# Patient Record
Sex: Male | Born: 1995 | Race: White | Hispanic: No | Marital: Single | Smoking: Never smoker
Health system: Southern US, Community
[De-identification: ages and names within clinical notes are randomized; demographics above are authoritative.]

## PROBLEM LIST (undated history)

## (undated) DIAGNOSIS — J45909 Unspecified asthma, uncomplicated: Secondary | ICD-10-CM

## (undated) DIAGNOSIS — T782XXA Anaphylactic shock, unspecified, initial encounter: Secondary | ICD-10-CM

## (undated) HISTORY — PX: TONSILLECTOMY AND ADENOIDECTOMY: SHX28

---
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
## 2020-02-10 ENCOUNTER — Ambulatory Visit (HOSPITAL_COMMUNITY)
Admission: EM | Admit: 2020-02-10 | Discharge: 2020-02-10 | Disposition: A | Discharge status: Home / Self Care | Payer: BC Managed Care – PPO | Attending: Family Medicine | Admitting: Family Medicine

## 2020-02-10 ENCOUNTER — Encounter (HOSPITAL_COMMUNITY): Payer: Self-pay

## 2020-02-10 ENCOUNTER — Other Ambulatory Visit: Payer: Self-pay

## 2020-02-10 DIAGNOSIS — R300 Dysuria: Secondary | ICD-10-CM | POA: Diagnosis present

## 2020-02-10 DIAGNOSIS — N390 Urinary tract infection, site not specified: Secondary | ICD-10-CM

## 2020-02-10 DIAGNOSIS — R369 Urethral discharge, unspecified: Secondary | ICD-10-CM | POA: Diagnosis present

## 2020-02-10 LAB — POCT URINALYSIS DIP (DEVICE)
Bilirubin Urine: NEGATIVE
Glucose, UA: NEGATIVE mg/dL
Ketones, ur: NEGATIVE mg/dL
Nitrite: NEGATIVE
Protein, ur: NEGATIVE mg/dL
Specific Gravity, Urine: 1.02 (ref 1.005–1.030)
Urobilinogen, UA: 0.2 mg/dL (ref 0.0–1.0)
pH: 6.5 (ref 5.0–8.0)

## 2020-02-10 MED ORDER — CEFTRIAXONE SODIUM 500 MG IJ SOLR
500.0000 mg | Freq: Once | INTRAMUSCULAR | Status: AC
  Administered 2020-02-10: 500 mg via INTRAMUSCULAR

## 2020-02-10 MED ORDER — CEFTRIAXONE SODIUM 500 MG IJ SOLR
INTRAMUSCULAR | Status: AC
  Filled 2020-02-10: qty 500

## 2020-02-10 MED ORDER — LIDOCAINE HCL (PF) 1 % IJ SOLN
INTRAMUSCULAR | Status: AC
  Filled 2020-02-10: qty 2

## 2020-02-10 MED ORDER — DOXYCYCLINE HYCLATE 100 MG PO CAPS: 100.0000 mg | ORAL_CAPSULE | Freq: Two times a day (BID) | ORAL | 0 refills | Status: AC

## 2020-02-10 NOTE — ED Provider Notes (Signed)
[facility]    CSN: [id number] Arrival date & time: 02/10/20  7253      History   Chief Complaint Chief Complaint  Patient presents with  . Urinary Tract Infection    HPI Kenneth Hess is a 24 y.o. male history of asthma presenting today for evaluation of dysuria and penile discharge.  Patient reports over the past couple days he has had developed increased discomfort with urination as well as discharge.  He denies any rashes or lesions.  Denies testicular pain or swelling.  Denies history of UTIs.  Does report no recent partner.  Denies abdominal pain nausea or vomiting.  HPI  Past Medical History:  Diagnosis Date  . Anaphylactic reaction   . Asthma     There are no problems to display for this patient.   History reviewed. No pertinent surgical history.     Home Medications    Prior to Admission medications   Medication Sig Start Date End Date Taking? Authorizing Provider  albuterol (PROVENTIL HFA;VENTOLIN HFA) 108 (90 BASE) MCG/ACT inhaler Inhale 2 puffs into the lungs every 6 (six) hours as needed for wheezing.    [provider]  doxycycline (VIBRAMYCIN) 100 MG capsule Take 1 capsule (100 mg total) by mouth 2 (two) times daily for 7 days. 02/10/20 02/17/20  Wieters, Hallie C, PA-C  EPINEPHrine (EPIPEN) 0.3 mg/0.3 mL DEVI Inject 0.3 mLs (0.3 mg total) into the muscle once. 05/02/12   Isaac Bliss, MD  ondansetron (ZOFRAN) 4 MG tablet Take 1 tablet (4 mg total) by mouth every 8 (eight) hours as needed for nausea or vomiting. 12/02/14   Waldemar Dickens, MD  ranitidine (ZANTAC) 150 MG tablet Take 150 mg by mouth 2 (two) times daily.    [provider]    Family History Family History  Problem Relation Age of Onset  . Diabetes Mother   . Diabetes Maternal Grandmother     Social History Social History   Tobacco Use  . Smoking status: Never Smoker  Substance Use Topics  . Alcohol use: No  . Drug use: No     Allergies   Bee  venom   Review of Systems Review of Systems  Constitutional: Negative for fever.  HENT: Negative for sore throat.   Respiratory: Negative for shortness of breath.   Cardiovascular: Negative for chest pain.  Gastrointestinal: Negative for abdominal pain, nausea and vomiting.  Genitourinary: Positive for discharge and dysuria. Negative for difficulty urinating, frequency, penile pain, penile swelling, scrotal swelling and testicular pain.  Skin: Negative for rash.  Neurological: Negative for dizziness, light-headedness and headaches.     Physical Exam Triage Vital Signs ED Triage Vitals  Enc Vitals Group     BP 02/10/20 1018 132/79     Pulse Rate 02/10/20 1018 89     Resp 02/10/20 1018 18     Temp 02/10/20 1018 98.2 F (36.8 C)     Temp Source 02/10/20 1018 Oral     SpO2 02/10/20 1018 100 %     Weight --      Height --      Head Circumference --      Peak Flow --      Pain Score 02/10/20 1020 5     Pain Loc --      Pain Edu? --      Excl. in [place]? --    No data found.  Updated Vital Signs BP 132/79 (BP Location: Right Arm)   Pulse  89   Temp 98.2 F (36.8 C) (Oral)   Resp 18   SpO2 100%   Visual Acuity Right Eye Distance:   Left Eye Distance:   Bilateral Distance:    Right Eye Near:   Left Eye Near:    Bilateral Near:     Physical Exam Vitals and nursing note reviewed.  Constitutional:      Appearance: He is well-developed.     Comments: No acute distress  HENT:     Head: Normocephalic and atraumatic.     Nose: Nose normal.  Eyes:     Conjunctiva/sclera: Conjunctivae normal.  Cardiovascular:     Rate and Rhythm: Normal rate.  Pulmonary:     Effort: Pulmonary effort is normal. No respiratory distress.  Abdominal:     General: There is no distension.     Comments: Soft, nondistended, nontender to light and deep palpation throughout abdomen   Musculoskeletal:        General: Normal range of motion.     Cervical back: Neck supple.  Skin:    General:  Skin is warm and dry.  Neurological:     Mental Status: He is alert and oriented to person, place, and time.      UC Treatments / Results  Labs (all labs ordered are listed, but only abnormal results are displayed) Labs Reviewed  POCT URINALYSIS DIP (DEVICE) - Abnormal; Notable for the following components:      Result Value   Hgb urine dipstick SMALL (*)    Leukocytes,Ua LARGE (*)    All other components within normal limits  CYTOLOGY, (ORAL, ANAL, URETHRAL) ANCILLARY ONLY    EKG   Radiology No results found.  Procedures Procedures (including critical care time)  Medications Ordered in UC Medications  cefTRIAXone (ROCEPHIN) injection 500 mg (has no administration in time range)    Initial Impression / Assessment and Plan / UC Course  I have reviewed the triage vital signs and the nursing notes.  Pertinent labs & imaging results that were available during my care of the patient were reviewed by me and considered in my medical decision making (see chart for details).     Suspect likely STD as cause of discharge and dysuria given age.  Recommending empiric treatment for gonorrhea and chlamydia today with Rocephin and doxycycline.  Urethral swab pending for testing.  Discussed strict return precautions. Patient verbalized understanding and is agreeable with plan.  Final Clinical Impressions(s) / UC Diagnoses   Final diagnoses:  Dysuria  Penile discharge     Discharge Instructions     We have treated you today for gonorrhea, with rocephin. Continue with doxycycline twice daily for 1 week to treat chlamydia. Please refrain from sexual activity for 7 days while medicine is clearing infection.  We are testing you for Gonorrhea, Chlamydia and Trichomonas. We will call you if anything is positive and let you know if you require any further treatment. Please inform partner of any positive results.  Please return if symptoms not improving with treatment, development of  fever, nausea, vomiting, abdominal pain, scrotal pain.   ED Prescriptions    Medication Sig Dispense Auth. Provider   doxycycline (VIBRAMYCIN) 100 MG capsule Take 1 capsule (100 mg total) by mouth 2 (two) times daily for 7 days. 14 capsule Wieters, Lovelady C, PA-C     PDMP not reviewed this encounter.   Lew Dawes, PA-C 02/10/20 1041

## 2020-02-10 NOTE — Discharge Instructions (Signed)
We have treated you today for gonorrhea, with rocephin. Continue with doxycycline twice daily for 1 week to treat chlamydia. Please refrain from sexual activity for 7 days while medicine is clearing infection.  We are testing you for Gonorrhea, Chlamydia and Trichomonas. We will call you if anything is positive and let you know if you require any further treatment. Please inform partner of any positive results.  Please return if symptoms not improving with treatment, development of fever, nausea, vomiting, abdominal pain, scrotal pain. 

## 2020-02-10 NOTE — ED Triage Notes (Signed)
Pt presents with burning during urination and penile discharge the past few days.

## 2020-02-11 LAB — CYTOLOGY, (ORAL, ANAL, URETHRAL) ANCILLARY ONLY
Chlamydia: POSITIVE — AB
Comment: NEGATIVE
Comment: NEGATIVE
Comment: NORMAL
Neisseria Gonorrhea: POSITIVE — AB
Trichomonas: NEGATIVE

## 2023-05-03 ENCOUNTER — Ambulatory Visit
Admission: EM | Admit: 2023-05-03 | Discharge: 2023-05-03 | Disposition: A | Discharge status: Home / Self Care | Payer: Commercial Managed Care - PPO | Attending: Family Medicine | Admitting: Family Medicine

## 2023-05-03 DIAGNOSIS — L03115 Cellulitis of right lower limb: Secondary | ICD-10-CM | POA: Diagnosis not present

## 2023-05-03 DIAGNOSIS — L03116 Cellulitis of left lower limb: Secondary | ICD-10-CM

## 2023-05-03 MED ORDER — PREDNISONE 20 MG PO TABS: 20.0000 mg | ORAL_TABLET | Freq: Every day | ORAL | 0 refills | Status: AC

## 2023-05-03 MED ORDER — SULFAMETHOXAZOLE-TRIMETHOPRIM 800-160 MG PO TABS: 1.0000 | ORAL_TABLET | Freq: Two times a day (BID) | ORAL | 0 refills | Status: AC

## 2023-05-03 NOTE — ED Provider Notes (Signed)
[facility]    CSN: [id number] Arrival date & time: 05/03/23  1758      History   Chief Complaint Chief Complaint  Patient presents with   Leg Swelling    HPI Kenneth Hess is a 27 y.o. male.   HPI Patient went kayaking  4 days ago and  stayed in the son longer than anticipated. He has experienced right leg swelling  when seemed to worsen today. Diffuse redness present both lower legs. Left leg is now blistering . He denies any significant pain nor does the rash itch.No fever and he is not experiencing any weeping from blisters on his leg. Past Medical History:  Diagnosis Date   Anaphylactic reaction    Asthma     There are no problems to display for this patient.   History reviewed. No pertinent surgical history.     Home Medications    Prior to Admission medications   Medication Sig Start Date End Date Taking? Authorizing Provider  predniSONE (DELTASONE) 20 MG tablet Take 1 tablet (20 mg total) by mouth daily with breakfast for 5 days. 05/03/23 05/08/23 Yes Bing Neighbors, NP  sulfamethoxazole-trimethoprim (BACTRIM DS) 800-160 MG tablet Take 1 tablet by mouth 2 (two) times daily for 7 days. 05/03/23 05/10/23 Yes Bing Neighbors, NP  albuterol (PROVENTIL HFA;VENTOLIN HFA) 108 (90 BASE) MCG/ACT inhaler Inhale 2 puffs into the lungs every 6 (six) hours as needed for wheezing.    [provider]  EPINEPHrine (EPIPEN) 0.3 mg/0.3 mL DEVI Inject 0.3 mLs (0.3 mg total) into the muscle once. 05/02/12   Marcellina Millin, MD  ondansetron (ZOFRAN) 4 MG tablet Take 1 tablet (4 mg total) by mouth every 8 (eight) hours as needed for nausea or vomiting. 12/02/14   Ozella Rocks, MD  ranitidine (ZANTAC) 150 MG tablet Take 150 mg by mouth 2 (two) times daily.    [provider]    Family History Family History  Problem Relation Age of Onset   Diabetes Mother    Diabetes Maternal Grandmother     Social History Social History   Tobacco Use    Smoking status: Never  Vaping Use   Vaping status: Every Day  Substance Use Topics   Alcohol use: Yes    Alcohol/week: 7.0 standard drinks of alcohol    Types: 7 Cans of beer per week   Drug use: Never     Allergies   Bee venom   Review of Systems Review of Systems Pertinent negatives listed in HPI   Physical Exam Triage Vital Signs ED Triage Vitals  Encounter Vitals Group     BP 05/03/23 1856 112/67     Systolic BP Percentile --      Diastolic BP Percentile --      Pulse Rate 05/03/23 1856 (!) 58     Resp 05/03/23 1856 16     Temp 05/03/23 1856 98 F (36.7 C)     Temp Source 05/03/23 1856 Oral     SpO2 05/03/23 1856 99 %     Weight --      Height --      Head Circumference --      Peak Flow --      Pain Score 05/03/23 1900 1     Pain Loc --      Pain Education --      Exclude from Growth Chart --    No data found.  Updated Vital Signs BP 112/67 (BP Location:  Left Arm)   Pulse (!) 58   Temp 98 F (36.7 C) (Oral)   Resp 16   SpO2 99%   Visual Acuity Right Eye Distance:   Left Eye Distance:   Bilateral Distance:    Right Eye Near:   Left Eye Near:    Bilateral Near:     Physical Exam Vitals reviewed.  Constitutional:      Appearance: Normal appearance.  HENT:     Head: Normocephalic and atraumatic.  Eyes:     Extraocular Movements: Extraocular movements intact.     Conjunctiva/sclera: Conjunctivae normal.     Pupils: Pupils are equal, round, and reactive to light.  Cardiovascular:     Rate and Rhythm: Normal rate and regular rhythm.  Pulmonary:     Effort: Pulmonary effort is normal.     Breath sounds: Normal breath sounds.  Musculoskeletal:     Right lower leg: Swelling present. 1+ Edema present.     Left lower leg: Swelling present. 2+ Edema present.  Skin:    Capillary Refill: Capillary refill takes less than 2 seconds.     Findings: Erythema and rash present. Rash is vesicular.  Neurological:     Mental Status: He is alert.        UC Treatments / Results  Labs (all labs ordered are listed, but only abnormal results are displayed) Labs Reviewed - No data to display  EKG   Radiology No results found.  Procedures Procedures (including critical care time)  Medications Ordered in UC Medications - No data to display  Initial Impression / Assessment and Plan / UC Course  I have reviewed the triage vital signs and the nursing notes.  Pertinent labs & imaging results that were available during my care of the patient were reviewed by me and considered in my medical decision making (see chart for details).    Cellulitis left and right and lower legs Prednisone 20 mg daily for 5 days. Bactrim twice daily for total of 7 days.   Return precautions given if symptoms worsen or do not improve. Final Clinical Impressions(s) / UC Diagnoses   Final diagnoses:  Cellulitis of left lower extremity  Cellulitis of right lower extremity   Discharge Instructions   None    ED Prescriptions     Medication Sig Dispense Auth. Provider   predniSONE (DELTASONE) 20 MG tablet Take 1 tablet (20 mg total) by mouth daily with breakfast for 5 days. 5 tablet Bing Neighbors, NP   sulfamethoxazole-trimethoprim (BACTRIM DS) 800-160 MG tablet Take 1 tablet by mouth 2 (two) times daily for 7 days. 14 tablet Bing Neighbors, NP      PDMP not reviewed this encounter.   Bing Neighbors, NP 05/03/23 [phone]

## 2023-05-03 NOTE — ED Triage Notes (Signed)
Pt reports leg swelling  and redness in the left leg since last night. Pt think is due to sunburn over the weekend. Pt right leg is also red.

## 2024-07-08 ENCOUNTER — Ambulatory Visit

## 2024-07-08 VITALS — BP 104/68 | HR 79 | Ht 71.0 in | Wt 158.8 lb

## 2024-07-08 DIAGNOSIS — Z7689 Persons encountering health services in other specified circumstances: Secondary | ICD-10-CM | POA: Insufficient documentation

## 2024-07-08 DIAGNOSIS — Z1329 Encounter for screening for other suspected endocrine disorder: Secondary | ICD-10-CM | POA: Diagnosis not present

## 2024-07-08 DIAGNOSIS — G8929 Other chronic pain: Secondary | ICD-10-CM

## 2024-07-08 DIAGNOSIS — Z130 Encounter for screening for diseases of the blood and blood-forming organs and certain disorders involving the immune mechanism: Secondary | ICD-10-CM

## 2024-07-08 DIAGNOSIS — R5383 Other fatigue: Secondary | ICD-10-CM | POA: Diagnosis not present

## 2024-07-08 DIAGNOSIS — Z13228 Encounter for screening for other metabolic disorders: Secondary | ICD-10-CM

## 2024-07-08 DIAGNOSIS — Z91030 Bee allergy status: Secondary | ICD-10-CM | POA: Insufficient documentation

## 2024-07-08 DIAGNOSIS — J4520 Mild intermittent asthma, uncomplicated: Secondary | ICD-10-CM | POA: Diagnosis not present

## 2024-07-08 DIAGNOSIS — Z1321 Encounter for screening for nutritional disorder: Secondary | ICD-10-CM

## 2024-07-08 DIAGNOSIS — M25561 Pain in right knee: Secondary | ICD-10-CM | POA: Insufficient documentation

## 2024-07-08 DIAGNOSIS — Z1159 Encounter for screening for other viral diseases: Secondary | ICD-10-CM

## 2024-07-08 MED ORDER — ALBUTEROL SULFATE HFA 108 (90 BASE) MCG/ACT IN AERS: 2.0000 | INHALATION_SPRAY | Freq: Four times a day (QID) | RESPIRATORY_TRACT | 10 refills | Status: AC | PRN

## 2024-07-08 MED ORDER — EPINEPHRINE 0.3 MG/0.3ML IJ SOAJ: 0.3000 mg | INTRAMUSCULAR | 0 refills | Status: AC | PRN

## 2024-07-08 MED ORDER — MELOXICAM 15 MG PO TABS: 15.0000 mg | ORAL_TABLET | Freq: Every day | ORAL | 1 refills | Status: AC

## 2024-07-08 NOTE — Assessment & Plan Note (Signed)
 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 - Prescribe meloxicam 15 mg once daily as needed for pain. - Advise to take meloxicam with food. - Instruct not to combine meloxicam with other NSAIDs, safe with acetaminophen. - Patient would like to defer x-ray to assess response to NSAID first

## 2024-07-08 NOTE — Assessment & Plan Note (Signed)
-   Checking labs today including A1c, TSH, vitamin D, and testosterone free and total  - Will follow up with results and treat deficiencies as indicated

## 2024-07-08 NOTE — Assessment & Plan Note (Signed)
 Discussed health maintenance, screenings, and lab work. Deferred tetanus and flu vaccine. - Order lab work including cholesterol, thyroid, A1c, kidney and liver function, blood counts, testosterone, HIV, and hepatitis C screening. - Defer tetanus vaccine as per his preference.  - CPE in 1 year, sooner PRN.

## 2024-07-08 NOTE — Assessment & Plan Note (Signed)
 Anaphylaxis risk with outdated EpiPen , discussed cost concerns. - Prescribe EpiPen . - Advise to check cost at pharmacy and inform if cost prohibitive for assistance.

## 2024-07-08 NOTE — Progress Notes (Signed)
 New Patient Office Visit  Subjective    Patient ID: Kenneth Hess, male    DOB: Mar 07, 1996  Age: 28 y.o. MRN: [id number]  CC:  Chief Complaint  Patient presents with   Establish Care   History of Present Illness   Kenneth Hess is a 28 year old male who presents for an establishing care appointment and evaluation of knee pain. He works in [employer] and is very physically active at work. He follows with psychiatry for ADHD and Depression with [facility]. He has not had a primary care in > 10 years.     Screenings:  Colon Cancer: N/A Lung Cancer: N/A Breast Cancer: N/A Diabetes: Endorses strong family hx; checking A1c with labs HLD: Checking lipid panel with labs The ASCVD Risk score (Arnett DK, et al., 2019) failed to calculate for the following reasons:   The 2019 ASCVD risk score is only valid for ages 82 to 15   Acute Problems:  Right Knee pain  - Knee pain primarily in the right knee, with occasional involvement of the left knee - Pain localized around the patella and sometimes wraps around the knee - No history of knee injury - Pain has been intermittent and ongoing for several months  Low energy and libido - Low energy and low libido, he believes may be associated with depression - Reports patchy beard - Interest in checking testosterone levels - Fasting today for lab tests  Medication management - Currently taking Vraylar 1.5 mg and Vyvanse 20 mg daily for ADHD and depression - History of albuterol  inhaler use and requests an update - 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   EPINEPHrine  0.3 mg/0.3 mL IJ SOAJ injection Inject 0.3 mg into  the muscle as needed for anaphylaxis.   lisdexamfetamine (VYVANSE) 20 MG capsule Take 20 mg by mouth every morning.   meloxicam (MOBIC) 15 MG tablet Take 1 tablet (15 mg total) by mouth daily.   [DISCONTINUED] EPINEPHrine  (EPIPEN ) 0.3 mg/0.3 mL DEVI Inject 0.3 mLs (0.3 mg total) into the muscle once.   albuterol  (VENTOLIN  HFA) 108 (90 Base) MCG/ACT inhaler Inhale 2 puffs into the lungs every 6 (six) hours as needed for wheezing.   [DISCONTINUED] albuterol  (PROVENTIL  HFA;VENTOLIN  HFA) 108 (90 BASE) MCG/ACT inhaler Inhale 2 puffs into the lungs every 6 (six) hours as needed for wheezing. (Patient not taking: Reported on 07/08/2024)   [DISCONTINUED] ondansetron  (ZOFRAN ) 4 MG tablet Take 1 tablet (4 mg total) by mouth every 8 (eight) hours as needed for nausea or vomiting. (Patient not taking: Reported on 07/08/2024)   [DISCONTINUED] ranitidine  (ZANTAC ) 150 MG tablet Take 150 mg by mouth 2 (two) times daily. (Patient not taking: Reported on 07/08/2024)   No facility-administered encounter medications on file as of 07/08/2024.    Past Medical History:  Diagnosis Date   Anaphylactic reaction    Asthma     Past Surgical History:  Procedure Laterality Date   TONSILLECTOMY AND ADENOIDECTOMY     in 4th grade    Family History  Problem Relation Age of Onset   Diabetes Mother    Diabetes Maternal Grandmother     Social History  Socioeconomic History   Marital status: Single    Spouse name: Not on file   Number of children: Not on file   Years of education: Not on file   Highest education level: Not on file  Occupational History   Not on file  Tobacco Use   Smoking status: Never   Smokeless tobacco: Not on file  Vaping Use   Vaping status: Every Day  Substance and Sexual Activity   Alcohol use: Yes    Alcohol/week: 7.0 standard drinks of alcohol    Types: 7 Cans of beer per week   Drug use: Never   Sexual activity: Yes  Other Topics Concern   Not on file  Social History  Narrative   Not on file   Social Drivers of Health   Financial Resource Strain: Medium Risk (07/08/2024)   Overall Financial Resource Strain (CARDIA)    Difficulty of Paying Living Expenses: Somewhat hard  Food Insecurity: No Food Insecurity (07/08/2024)   Hunger Vital Sign    Worried About Running Out of Food in the Last Year: Never true    Ran Out of Food in the Last Year: Never true  Transportation Needs: No Transportation Needs (07/08/2024)   PRAPARE - Administrator, Civil Service (Medical): No    Lack of Transportation (Non-Medical): No  Physical Activity: Sufficiently Active (07/08/2024)   Exercise Vital Sign    Days of Exercise per Week: 3 days    Minutes of Exercise per Session: 60 min  Stress: Stress Concern Present (07/08/2024)   [employer] of Occupational Health - Occupational Stress Questionnaire    Feeling of Stress: To some extent  Social Connections: Moderately Integrated (07/08/2024)   Social Connection and Isolation Panel    Frequency of Communication with Friends and Family: More than three times a week    Frequency of Social Gatherings with Friends and Family: Once a week    Attends Religious Services: More than 4 times per year    Active Member of [employer] or Organizations: No    Attends Banker Meetings: Never    Marital Status: Living with partner  Intimate Partner Violence: Not At Risk (07/08/2024)   Humiliation, Afraid, Rape, and Kick questionnaire    Fear of Current or Ex-Partner: No    Emotionally Abused: No    Physically Abused: No    Sexually Abused: No    ROS  Per HPI      Objective    BP 104/68   Pulse 79   Ht 5' 11 (1.803 m)   Wt 158 lb 12 oz (72 kg)   SpO2 100%   BMI 22.14 kg/m   Physical Exam Constitutional:      General: He is not in acute distress.    Appearance: Normal appearance.  Cardiovascular:     Rate and Rhythm: Normal rate and regular rhythm.     Heart sounds: Normal heart sounds. No  murmur heard.    No friction rub. No gallop.  Pulmonary:     Effort: Pulmonary effort is normal. No respiratory distress.     Breath sounds: Normal breath sounds.  Abdominal:     General: Bowel sounds are normal.  Musculoskeletal:        General: No swelling.     Cervical back: Neck supple.     Right knee: Normal. No swelling. Normal range of motion. No ACL laxity or PCL laxity.     Left knee: Normal. No swelling. Normal range of  motion. No ACL laxity or PCL laxity. Lymphadenopathy:     Cervical: No cervical adenopathy.  Skin:    General: Skin is warm and dry.  Neurological:     General: No focal deficit present.     Mental Status: He is alert.  Psychiatric:        Mood and Affect: Mood normal.        Behavior: Behavior normal.        Thought Content: Thought content normal.          Assessment & Plan:   Screening for endocrine, nutritional, metabolic and immunity disorder -     VITAMIN D 25 Hydroxy (Vit-D Deficiency, Fractures); Future -     CBC with Differential/Platelet; Future -     Hemoglobin A1c; Future -     Comprehensive metabolic panel with GFR; Future -     Lipid panel; Future -     TSH; Future  Allergy to bee sting -     EPINEPHrine ; Inject 0.3 mg into the muscle as needed for anaphylaxis.  Dispense: 1 each; Refill: 0  Mild intermittent asthma without complication Assessment & Plan: Outdated albuterol  inhaler, prefers Ventolin  HFA. - Prescribe Ventolin  HFA inhaler refill  Orders: -     Albuterol  Sulfate HFA; Inhale 2 puffs into the lungs every 6 (six) hours as needed for wheezing.  Dispense: 8 g; Refill: 10  Screening for viral disease -     HIV Antibody (routine testing w rflx); Future -     Hepatitis C antibody; Future  Other fatigue Assessment & Plan: - Checking labs today including A1c, TSH, vitamin D, and testosterone free and total  - Will follow up with results and treat deficiencies as indicated  Orders: -     VITAMIN D 25 Hydroxy (Vit-D  Deficiency, Fractures); Future -     TSH; Future -     Testosterone,Free and Total; Future  Chronic pain of right knee Assessment & Plan: Chronic pain localized to kneecap, no injury, arthritis unlikely due to age. - Prescribe meloxicam 15 mg once daily as needed for pain. - Advise to take meloxicam with food. - Instruct not to combine meloxicam with other NSAIDs, safe with acetaminophen. - Patient would like to defer x-ray to assess response to NSAID first    Allergy to honey bee venom Assessment & Plan: Anaphylaxis risk with outdated EpiPen , discussed cost concerns. - Prescribe EpiPen . - Advise to check cost at pharmacy and inform if cost prohibitive for assistance.   Encounter to establish care Assessment & Plan: Discussed health maintenance, screenings, and lab work. Deferred tetanus and flu vaccine. - Order lab work including cholesterol, thyroid, A1c, kidney and liver function, blood counts, testosterone, HIV, and hepatitis C screening. - Defer tetanus vaccine as per his preference.  - CPE in 1 year, sooner PRN.      Other orders -     Meloxicam; Take 1 tablet (15 mg total) by mouth daily.  Dispense: 30 tablet; Refill: 1  Return in about 1 year (around 07/08/2025) for Physical.   Saddie JULIANNA Sacks, PA-C

## 2024-07-08 NOTE — Patient Instructions (Addendum)
 VISIT SUMMARY: Today, we addressed your knee pain, low energy, medication management, and preventive care. We also discussed your risk of diabetes and updated your prescriptions for asthma and anaphylaxis.  YOUR PLAN: LEFT KNEE PAIN: You have chronic pain around your kneecap without any injury history. -Take meloxicam once daily as needed for pain. -Always take meloxicam with food. -Do not combine meloxicam with other NSAIDs, but it is safe to use with acetaminophen.  ALLERGY TO BEE VENOM WITH RISK OF ANAPHYLAXIS: You have a severe allergy to bee stings and your EpiPen  is outdated. -A new EpiPen  has been prescribed. -Check the cost at the pharmacy and let us  know if it is too expensive so we can assist you.  ASTHMA: Your albuterol  inhaler is outdated and you prefer Ventolin  HFA. -A new Ventolin  HFA inhaler has been prescribed.  ADULT WELLNESS VISIT: We discussed your overall health and preventive care needs. -Lab work has been ordered to check cholesterol, thyroid, A1c, kidney and liver function, blood counts, testosterone, HIV, and hepatitis C. -You chose to defer the tetanus vaccine for now.  If you have any problems before your next visit feel free to message me via MyChart (minor issues or questions) or call the office, otherwise you may reach out to schedule an office visit.  Thank you! Saddie Sacks, PA-C

## 2024-07-08 NOTE — Assessment & Plan Note (Signed)
 Outdated albuterol  inhaler, prefers Ventolin  HFA. - Prescribe Ventolin  HFA inhaler refill

## 2024-07-11 ENCOUNTER — Ambulatory Visit: Payer: Self-pay

## 2024-07-13 LAB — HIV ANTIBODY (ROUTINE TESTING W REFLEX): HIV Screen 4th Generation wRfx: NONREACTIVE

## 2024-07-13 LAB — COMPREHENSIVE METABOLIC PANEL WITH GFR
ALT: 17 IU/L (ref 0–44)
AST: 20 IU/L (ref 0–40)
Albumin: 5 g/dL (ref 4.3–5.2)
Alkaline Phosphatase: 64 IU/L (ref 47–123)
BUN/Creatinine Ratio: 9 (ref 9–20)
BUN: 10 mg/dL (ref 6–20)
Bilirubin Total: 0.7 mg/dL (ref 0.0–1.2)
CO2: 24 mmol/L (ref 20–29)
Calcium: 9.8 mg/dL (ref 8.7–10.2)
Chloride: 100 mmol/L (ref 96–106)
Creatinine, Ser: 1.11 mg/dL (ref 0.76–1.27)
Globulin, Total: 2.1 g/dL (ref 1.5–4.5)
Glucose: 86 mg/dL (ref 70–99)
Potassium: 4.3 mmol/L (ref 3.5–5.2)
Sodium: 138 mmol/L (ref 134–144)
Total Protein: 7.1 g/dL (ref 6.0–8.5)
eGFR: 93 mL/min/1.73 (ref >59)

## 2024-07-13 LAB — HEPATITIS C ANTIBODY: Hep C Virus Ab: NONREACTIVE

## 2024-07-13 LAB — CBC WITH DIFFERENTIAL/PLATELET
Basophils Absolute: 0 x10E3/uL (ref 0.0–0.2)
Basos: 0 %
EOS (ABSOLUTE): 0.2 x10E3/uL (ref 0.0–0.4)
Eos: 3 %
Hematocrit: 47.5 % (ref 37.5–51.0)
Hemoglobin: 15.9 g/dL (ref 13.0–17.7)
Immature Grans (Abs): 0 x10E3/uL (ref 0.0–0.1)
Immature Granulocytes: 0 %
Lymphocytes Absolute: 1.6 x10E3/uL (ref 0.7–3.1)
Lymphs: 26 %
MCH: 29.4 pg (ref 26.6–33.0)
MCHC: 33.5 g/dL (ref 31.5–35.7)
MCV: 88 fL (ref 79–97)
Monocytes Absolute: 0.5 x10E3/uL (ref 0.1–0.9)
Monocytes: 9 %
Neutrophils Absolute: 3.8 x10E3/uL (ref 1.4–7.0)
Neutrophils: 62 %
Platelets: 264 x10E3/uL (ref 150–450)
RBC: 5.41 x10E6/uL (ref 4.14–5.80)
RDW: 12.4 % (ref 11.6–15.4)
WBC: 6.1 x10E3/uL (ref 3.4–10.8)

## 2024-07-13 LAB — TSH: TSH: 1.2 u[IU]/mL (ref 0.450–4.500)

## 2024-07-13 LAB — LIPID PANEL
Chol/HDL Ratio: 2.7 ratio (ref 0.0–5.0)
Cholesterol, Total: 212 mg/dL — ABNORMAL HIGH (ref 100–199)
HDL: 80 mg/dL (ref >39)
LDL Chol Calc (NIH): 119 mg/dL — ABNORMAL HIGH (ref 0–99)
Triglycerides: 72 mg/dL (ref 0–149)
VLDL Cholesterol Cal: 13 mg/dL (ref 5–40)

## 2024-07-13 LAB — TESTOSTERONE,FREE AND TOTAL
Testosterone, Free: 16.1 pg/mL (ref 9.3–26.5)
Testosterone: 620 ng/dL (ref 264–916)

## 2024-07-13 LAB — HEMOGLOBIN A1C
Est. average glucose Bld gHb Est-mCnc: 105 mg/dL
Hgb A1c MFr Bld: 5.3 % (ref 4.8–5.6)

## 2024-07-13 LAB — VITAMIN D 25 HYDROXY (VIT D DEFICIENCY, FRACTURES): Vit D, 25-Hydroxy: 33.5 ng/mL (ref 30.0–100.0)

## 2025-07-09 ENCOUNTER — Encounter
